# Patient Record
Sex: Male | Born: 1980 | Race: White | Hispanic: Yes | Marital: Single | Smoking: Current some day smoker
Health system: Southern US, Community
[De-identification: ages and names within clinical notes are randomized; demographics above are authoritative.]

---

## 2005-05-30 ENCOUNTER — Emergency Department (HOSPITAL_COMMUNITY): Admission: EM | Admit: 2005-05-30 | Discharge: 2005-05-30 | Payer: Self-pay | Admitting: Emergency Medicine

## 2005-09-01 ENCOUNTER — Emergency Department (HOSPITAL_COMMUNITY): Admission: EM | Admit: 2005-09-01 | Discharge: 2005-09-01 | Payer: Self-pay | Admitting: Emergency Medicine

## 2005-09-17 ENCOUNTER — Emergency Department (HOSPITAL_COMMUNITY): Admission: EM | Admit: 2005-09-17 | Discharge: 2005-09-17 | Payer: Self-pay | Admitting: Emergency Medicine

## 2009-07-13 ENCOUNTER — Emergency Department (HOSPITAL_COMMUNITY): Admission: EM | Admit: 2009-07-13 | Discharge: 2009-07-13 | Payer: Self-pay | Admitting: Family Medicine

## 2010-06-21 ENCOUNTER — Inpatient Hospital Stay (HOSPITAL_COMMUNITY): Admission: EM | Admit: 2010-06-21 | Discharge: 2010-06-23 | Payer: Self-pay | Admitting: Emergency Medicine

## 2010-06-28 ENCOUNTER — Encounter
Admission: RE | Admit: 2010-06-28 | Discharge: 2010-08-04 | Payer: Self-pay | Source: Home / Self Care | Attending: Otolaryngology | Admitting: Otolaryngology

## 2010-07-07 ENCOUNTER — Ambulatory Visit (HOSPITAL_COMMUNITY): Admission: RE | Admit: 2010-07-07 | Discharge: 2010-07-07 | Payer: Self-pay | Admitting: Otolaryngology

## 2010-10-26 LAB — AFB CULTURE WITH SMEAR (NOT AT ARMC): Acid Fast Smear: NONE SEEN

## 2010-10-26 LAB — CBC
HCT: 41.1 % (ref 39.0–52.0)
HCT: 41.7 % (ref 39.0–52.0)
HCT: 43.1 % (ref 39.0–52.0)
Hemoglobin: 14.5 g/dL (ref 13.0–17.0)
Hemoglobin: 14.7 g/dL (ref 13.0–17.0)
Hemoglobin: 15.6 g/dL (ref 13.0–17.0)
MCH: 28.4 pg (ref 26.0–34.0)
MCH: 28.7 pg (ref 26.0–34.0)
MCH: 28.9 pg (ref 26.0–34.0)
MCHC: 35.3 g/dL (ref 30.0–36.0)
MCHC: 35.3 g/dL (ref 30.0–36.0)
MCHC: 36.2 g/dL — ABNORMAL HIGH (ref 30.0–36.0)
MCV: 80 fL (ref 78.0–100.0)
MCV: 80.7 fL (ref 78.0–100.0)
MCV: 81.2 fL (ref 78.0–100.0)
Platelets: 136 10*3/uL — ABNORMAL LOW (ref 150–400)
Platelets: 143 10*3/uL — ABNORMAL LOW (ref 150–400)
Platelets: 171 10*3/uL (ref 150–400)
RBC: 5.06 MIL/uL (ref 4.22–5.81)
RBC: 5.17 MIL/uL (ref 4.22–5.81)
RBC: 5.39 MIL/uL (ref 4.22–5.81)
RDW: 12.4 % (ref 11.5–15.5)
RDW: 12.6 % (ref 11.5–15.5)
RDW: 12.7 % (ref 11.5–15.5)
WBC: 5.8 10*3/uL (ref 4.0–10.5)
WBC: 5.9 10*3/uL (ref 4.0–10.5)
WBC: 9.5 10*3/uL (ref 4.0–10.5)

## 2010-10-26 LAB — DIFFERENTIAL
Basophils Absolute: 0.1 10*3/uL (ref 0.0–0.1)
Basophils Relative: 1 % (ref 0–1)
Eosinophils Absolute: 0.2 10*3/uL (ref 0.0–0.7)
Eosinophils Relative: 3 % (ref 0–5)
Lymphocytes Relative: 23 % (ref 12–46)
Lymphs Abs: 2.2 10*3/uL (ref 0.7–4.0)
Monocytes Absolute: 0.8 10*3/uL (ref 0.1–1.0)
Monocytes Relative: 8 % (ref 3–12)
Neutro Abs: 6.2 10*3/uL (ref 1.7–7.7)
Neutrophils Relative %: 66 % (ref 43–77)

## 2010-10-26 LAB — GRAM STAIN

## 2010-10-26 LAB — BASIC METABOLIC PANEL
BUN: 15 mg/dL (ref 6–23)
BUN: 8 mg/dL (ref 6–23)
CO2: 25 mEq/L (ref 19–32)
CO2: 27 mEq/L (ref 19–32)
Calcium: 8.7 mg/dL (ref 8.4–10.5)
Calcium: 8.9 mg/dL (ref 8.4–10.5)
Chloride: 106 mEq/L (ref 96–112)
Chloride: 106 mEq/L (ref 96–112)
Creatinine, Ser: 0.78 mg/dL (ref 0.4–1.5)
Creatinine, Ser: 0.98 mg/dL (ref 0.4–1.5)
GFR calc Af Amer: >60 mL/min (ref >60)
GFR calc Af Amer: >60 mL/min (ref >60)
GFR calc non Af Amer: >60 mL/min (ref >60)
GFR calc non Af Amer: >60 mL/min (ref >60)
Glucose, Bld: 88 mg/dL (ref 70–99)
Glucose, Bld: 94 mg/dL (ref 70–99)
Potassium: 3.8 mEq/L (ref 3.5–5.1)
Potassium: 3.8 mEq/L (ref 3.5–5.1)
Sodium: 138 mEq/L (ref 135–145)
Sodium: 138 mEq/L (ref 135–145)

## 2010-10-26 LAB — WOUND CULTURE

## 2010-10-26 LAB — SEDIMENTATION RATE: Sed Rate: 10 mm/hr (ref 0–16)

## 2010-10-26 LAB — ANAEROBIC CULTURE

## 2010-10-26 LAB — SURGICAL PCR SCREEN
MRSA, PCR: NEGATIVE
Staphylococcus aureus: NEGATIVE

## 2010-10-26 LAB — C-REACTIVE PROTEIN: CRP: 1.1 mg/dL — ABNORMAL HIGH (ref <0.6)

## 2012-04-28 IMAGING — CR DG HAND COMPLETE 3+V*R*
3 series · 3 of 3 positions shown · non-contrast
Comparison: None.

CLINICAL DATA: Right hand laceration.  Pain.  Infection.

RIGHT HAND - COMPLETE 3+ VIEW

[x hand pa right *]
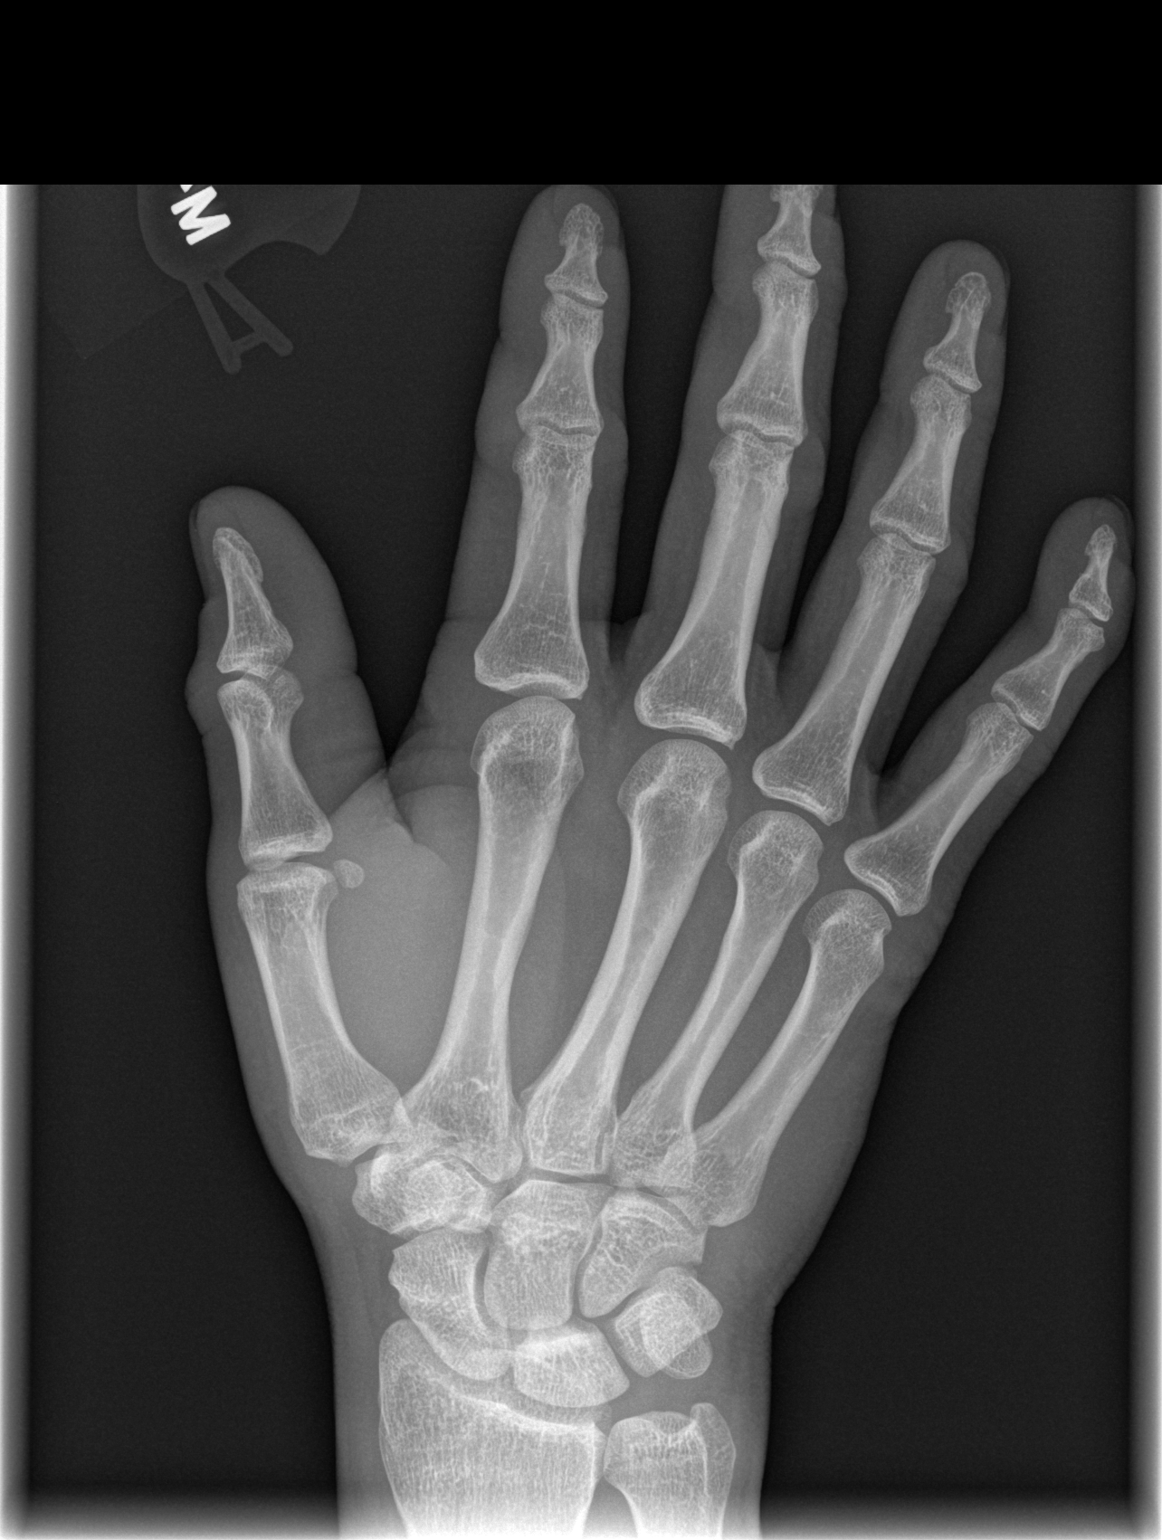

[x hand oblique right *]
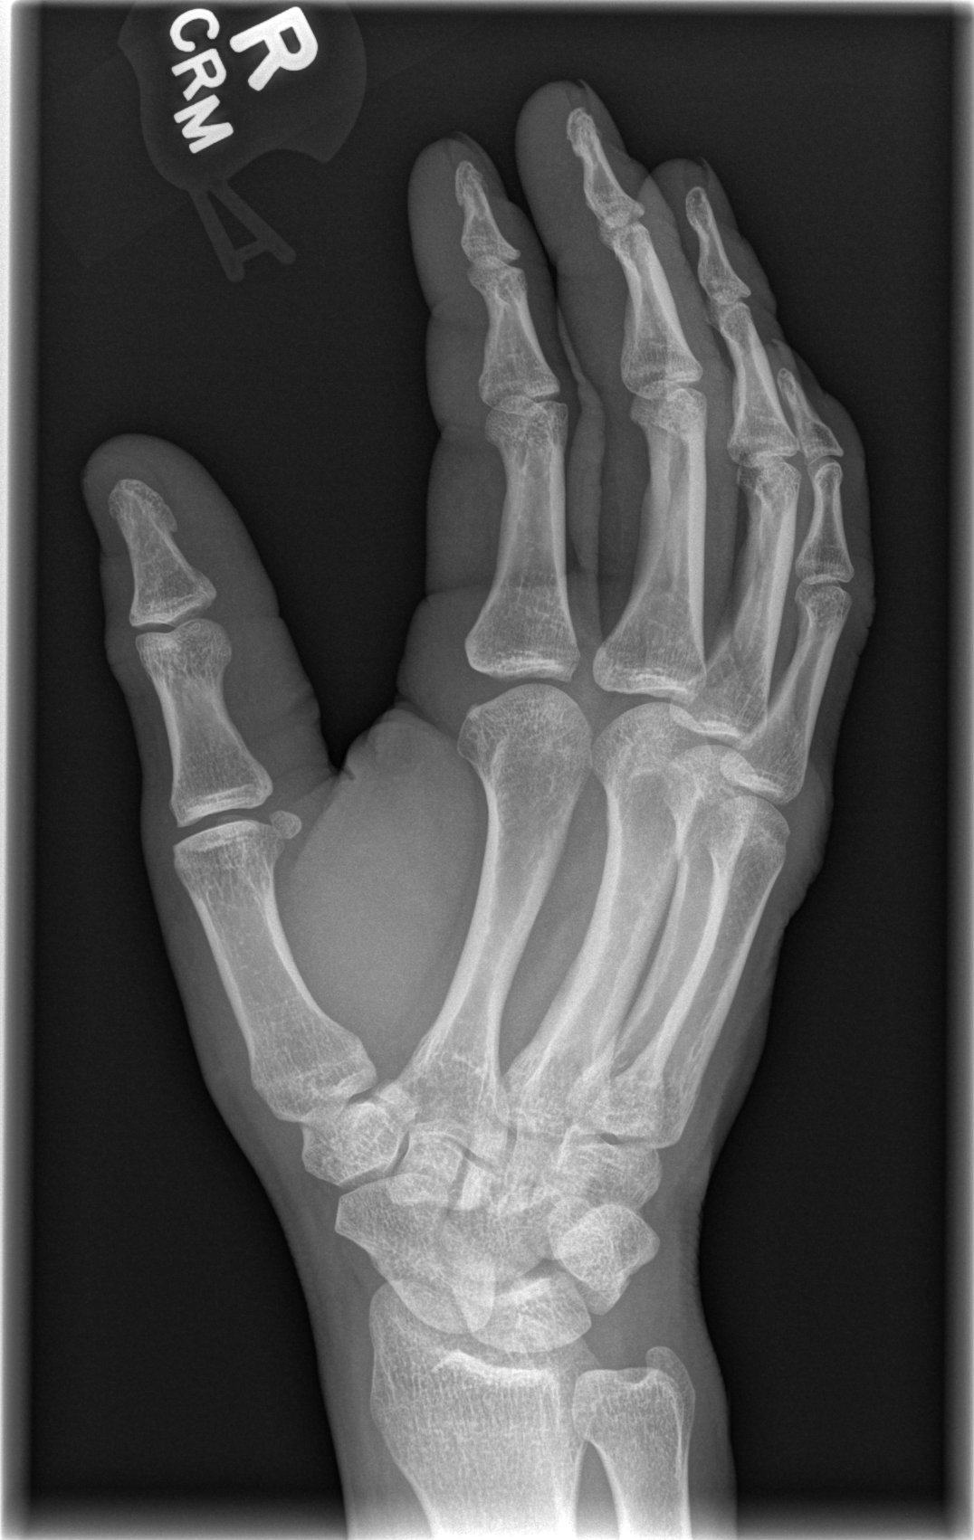

[x hand lat right]
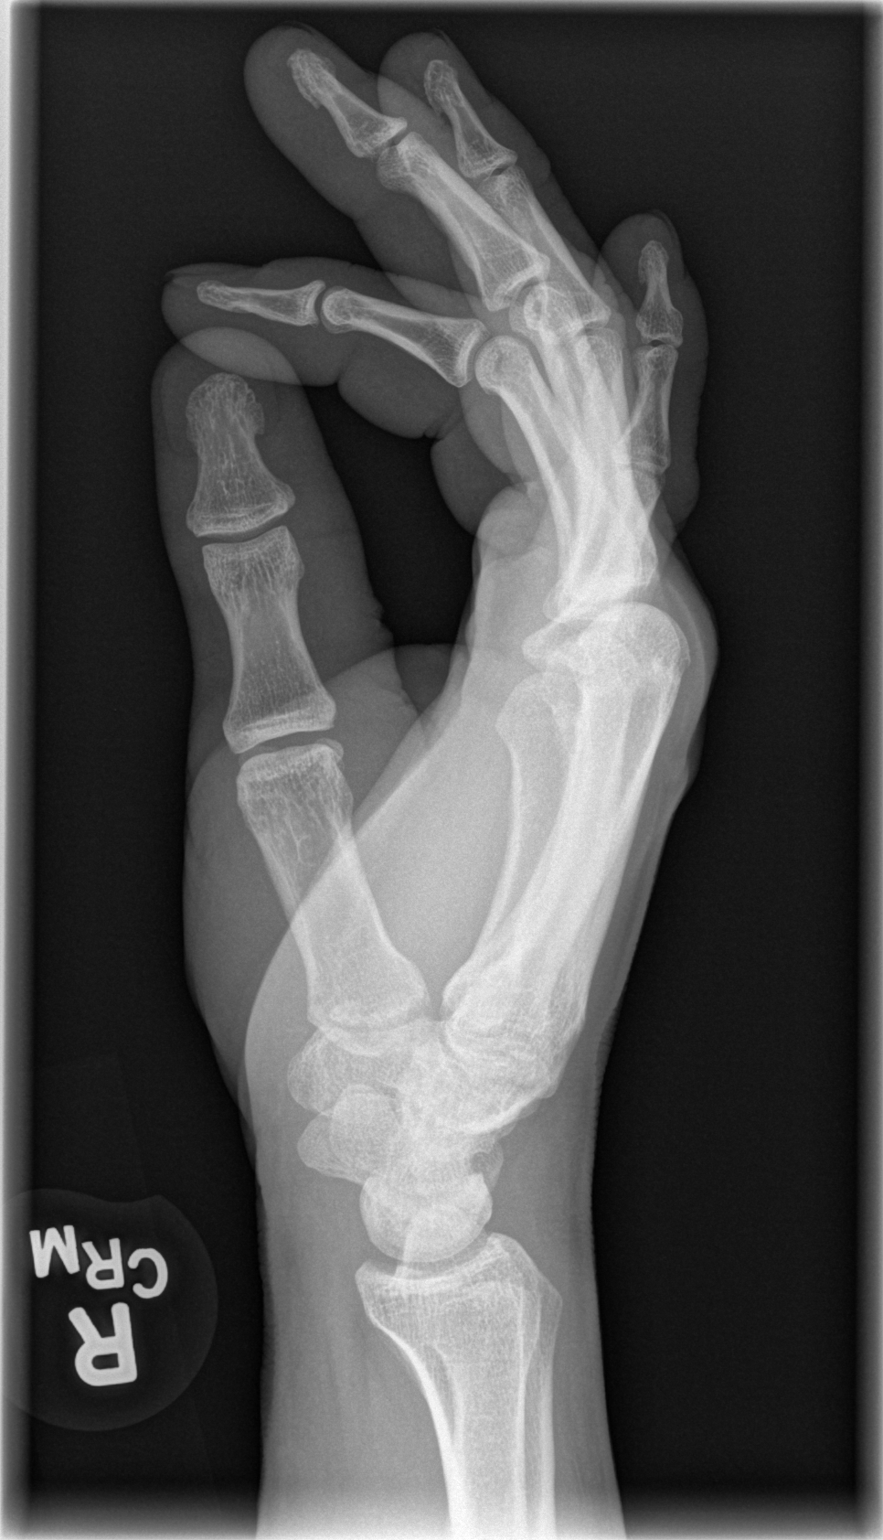

[3 of 3 positions shown; findings below may reference images not displayed]

FINDINGS: Alignment of bones of the right hand is anatomic.  There
is no fracture or radiopaque foreign body.  The first ray appears
within normal limits without fracture.
IMPRESSION: No acute abnormality.

## 2013-04-06 ENCOUNTER — Observation Stay (HOSPITAL_COMMUNITY)
Admission: EM | Admit: 2013-04-06 | Discharge: 2013-04-07 | Disposition: A | Discharge status: Home / Self Care | Payer: BC Managed Care – PPO | Attending: General Surgery | Admitting: General Surgery

## 2013-04-06 ENCOUNTER — Encounter (HOSPITAL_COMMUNITY)
Admission: EM | Disposition: A | Discharge status: Home / Self Care | Payer: Self-pay | Source: Home / Self Care | Attending: Emergency Medicine

## 2013-04-06 ENCOUNTER — Emergency Department (HOSPITAL_COMMUNITY): Payer: BC Managed Care – PPO

## 2013-04-06 ENCOUNTER — Emergency Department (HOSPITAL_COMMUNITY): Payer: BC Managed Care – PPO | Admitting: Anesthesiology

## 2013-04-06 ENCOUNTER — Encounter (HOSPITAL_COMMUNITY): Payer: Self-pay | Admitting: Anesthesiology

## 2013-04-06 ENCOUNTER — Encounter (HOSPITAL_COMMUNITY): Payer: Self-pay | Admitting: *Deleted

## 2013-04-06 DIAGNOSIS — Z01812 Encounter for preprocedural laboratory examination: Secondary | ICD-10-CM | POA: Insufficient documentation

## 2013-04-06 DIAGNOSIS — K3580 Unspecified acute appendicitis: Principal | ICD-10-CM | POA: Insufficient documentation

## 2013-04-06 DIAGNOSIS — K37 Unspecified appendicitis: Secondary | ICD-10-CM

## 2013-04-06 HISTORY — PX: LAPAROSCOPIC APPENDECTOMY: SHX408

## 2013-04-06 LAB — CBC WITH DIFFERENTIAL/PLATELET
Basophils Absolute: 0 10*3/uL (ref 0.0–0.1)
Basophils Relative: 1 % (ref 0–1)
Eosinophils Relative: 2 % (ref 0–5)
HCT: 39.6 % (ref 39.0–52.0)
Hemoglobin: 14.2 g/dL (ref 13.0–17.0)
Lymphocytes Relative: 40 % (ref 12–46)
Lymphs Abs: 2.2 10*3/uL (ref 0.7–4.0)
MCH: 28.9 pg (ref 26.0–34.0)
MCHC: 35.9 g/dL (ref 30.0–36.0)
MCV: 80.7 fL (ref 78.0–100.0)
Monocytes Absolute: 0.5 10*3/uL (ref 0.1–1.0)
Neutro Abs: 2.6 10*3/uL (ref 1.7–7.7)
Neutrophils Relative %: 49 % (ref 43–77)
Platelets: 152 10*3/uL (ref 150–400)
RBC: 4.91 MIL/uL (ref 4.22–5.81)
RDW: 13 % (ref 11.5–15.5)
WBC: 5.4 10*3/uL (ref 4.0–10.5)

## 2013-04-06 LAB — URINALYSIS, ROUTINE W REFLEX MICROSCOPIC
Bilirubin Urine: NEGATIVE
Glucose, UA: NEGATIVE mg/dL
Hgb urine dipstick: NEGATIVE
Ketones, ur: NEGATIVE mg/dL
Leukocytes, UA: NEGATIVE
Nitrite: NEGATIVE
Protein, ur: NEGATIVE mg/dL
pH: 5.5 (ref 5.0–8.0)

## 2013-04-06 LAB — COMPREHENSIVE METABOLIC PANEL
ALT: 23 U/L (ref 0–53)
AST: 21 U/L (ref 0–37)
Albumin: 3.7 g/dL (ref 3.5–5.2)
Alkaline Phosphatase: 51 U/L (ref 39–117)
CO2: 25 mEq/L (ref 19–32)
Chloride: 104 mEq/L (ref 96–112)
Creatinine, Ser: 0.78 mg/dL (ref 0.50–1.35)
GFR calc Af Amer: >90 mL/min (ref >90)
GFR calc non Af Amer: >90 mL/min (ref >90)
Glucose, Bld: 73 mg/dL (ref 70–99)
Potassium: 3.4 mEq/L — ABNORMAL LOW (ref 3.5–5.1)
Sodium: 136 mEq/L (ref 135–145)
Total Bilirubin: 0.5 mg/dL (ref 0.3–1.2)
Total Protein: 7 g/dL (ref 6.0–8.3)

## 2013-04-06 LAB — LIPASE, BLOOD: Lipase: 16 U/L (ref 11–59)

## 2013-04-06 SURGERY — APPENDECTOMY, LAPAROSCOPIC
Anesthesia: General | Site: Abdomen | Wound class: Contaminated

## 2013-04-06 MED ORDER — IOHEXOL 300 MG/ML  SOLN
100.0000 mL | Freq: Once | INTRAMUSCULAR | Status: AC | PRN
  Administered 2013-04-06: 100 mL via INTRAVENOUS

## 2013-04-06 MED ORDER — GLYCOPYRROLATE 0.2 MG/ML IJ SOLN
INTRAMUSCULAR | Status: DC | PRN
  Administered 2013-04-06: 0.2 mg via INTRAVENOUS
  Administered 2013-04-06: 0.6 mg via INTRAVENOUS

## 2013-04-06 MED ORDER — SUCCINYLCHOLINE CHLORIDE 20 MG/ML IJ SOLN
INTRAMUSCULAR | Status: DC | PRN
  Administered 2013-04-06: 120 mg via INTRAVENOUS

## 2013-04-06 MED ORDER — FENTANYL CITRATE 0.05 MG/ML IJ SOLN
INTRAMUSCULAR | Status: DC | PRN
  Administered 2013-04-06 (×2): 50 ug via INTRAVENOUS
  Administered 2013-04-06: 100 ug via INTRAVENOUS
  Administered 2013-04-06 (×3): 50 ug via INTRAVENOUS

## 2013-04-06 MED ORDER — SODIUM CHLORIDE 0.9 % IV BOLUS (SEPSIS)
1000.0000 mL | Freq: Once | INTRAVENOUS | Status: AC
  Administered 2013-04-06: 1000 mL via INTRAVENOUS

## 2013-04-06 MED ORDER — DEXTROSE 5 % IV SOLN
2.0000 g | INTRAVENOUS | Status: AC
  Administered 2013-04-06: 2 g via INTRAVENOUS

## 2013-04-06 MED ORDER — LACTATED RINGERS IV SOLN
INTRAVENOUS | Status: DC | PRN
  Administered 2013-04-06: 22:00:00 via INTRAVENOUS

## 2013-04-06 MED ORDER — ROCURONIUM BROMIDE 100 MG/10ML IV SOLN
INTRAVENOUS | Status: DC | PRN
  Administered 2013-04-06: 5 mg via INTRAVENOUS
  Administered 2013-04-06: 20 mg via INTRAVENOUS

## 2013-04-06 MED ORDER — LACTATED RINGERS IV SOLN: INTRAVENOUS | Status: DC | PRN

## 2013-04-06 MED ORDER — CELECOXIB 100 MG PO CAPS
200.0000 mg | ORAL_CAPSULE | Freq: Two times a day (BID) | ORAL | Status: DC
  Administered 2013-04-06 – 2013-04-07 (×2): 200 mg via ORAL
  Filled 2013-04-06 (×2): qty 2

## 2013-04-06 MED ORDER — PANTOPRAZOLE SODIUM 40 MG IV SOLR
40.0000 mg | Freq: Every day | INTRAVENOUS | Status: DC
  Administered 2013-04-06: 40 mg via INTRAVENOUS
  Filled 2013-04-06: qty 40

## 2013-04-06 MED ORDER — BUPIVACAINE HCL 0.5 % IJ SOLN
INTRAMUSCULAR | Status: DC | PRN
  Administered 2013-04-06: 10 mL

## 2013-04-06 MED ORDER — HYDROCODONE-ACETAMINOPHEN 5-325 MG PO TABS
1.0000 | ORAL_TABLET | ORAL | Status: DC | PRN
  Administered 2013-04-06: 1 via ORAL
  Administered 2013-04-07 (×2): 2 via ORAL
  Filled 2013-04-06: qty 1
  Filled 2013-04-06 (×2): qty 2

## 2013-04-06 MED ORDER — MIDAZOLAM HCL 5 MG/5ML IJ SOLN
INTRAMUSCULAR | Status: DC | PRN
  Administered 2013-04-06: 2 mg via INTRAVENOUS

## 2013-04-06 MED ORDER — SODIUM CHLORIDE 0.9 % IV SOLN
INTRAVENOUS | Status: DC
  Administered 2013-04-06: 20:00:00 via INTRAVENOUS

## 2013-04-06 MED ORDER — NEOSTIGMINE METHYLSULFATE 1 MG/ML IJ SOLN
INTRAMUSCULAR | Status: DC | PRN
  Administered 2013-04-06: 4 mg via INTRAVENOUS

## 2013-04-06 MED ORDER — SODIUM CHLORIDE 0.9 % IR SOLN
Status: DC | PRN
  Administered 2013-04-06: 1000 mL

## 2013-04-06 MED ORDER — ONDANSETRON HCL 4 MG/2ML IJ SOLN: 4.0000 mg | Freq: Four times a day (QID) | INTRAMUSCULAR | Status: DC | PRN

## 2013-04-06 MED ORDER — IOHEXOL 300 MG/ML  SOLN
50.0000 mL | Freq: Once | INTRAMUSCULAR | Status: AC | PRN
  Administered 2013-04-06: 50 mL via ORAL

## 2013-04-06 MED ORDER — CHLORDIAZEPOXIDE HCL 25 MG PO CAPS: 25.0000 mg | ORAL_CAPSULE | Freq: Three times a day (TID) | ORAL | Status: DC | PRN

## 2013-04-06 SURGICAL SUPPLY — 48 items
APL SKNCLS STERI-STRIP NONHPOA (GAUZE/BANDAGES/DRESSINGS) ×1
BAG HAMPER (MISCELLANEOUS) ×2 IMPLANT
BAG SPEC RTRVL LRG 6X4 10 (ENDOMECHANICALS) ×1
BENZOIN TINCTURE PRP APPL 2/3 (GAUZE/BANDAGES/DRESSINGS) ×2 IMPLANT
CLOTH BEACON ORANGE TIMEOUT ST (SAFETY) ×2 IMPLANT
COVER LIGHT HANDLE STERIS (MISCELLANEOUS) ×4 IMPLANT
CUTTER ENDO LINEAR 45M (STAPLE) ×1 IMPLANT
CUTTER FLEX LINEAR 45M (STAPLE) ×1 IMPLANT
DECANTER SPIKE VIAL GLASS SM (MISCELLANEOUS) ×2 IMPLANT
DEVICE TROCAR PUNCTURE CLOSURE (ENDOMECHANICALS) ×2 IMPLANT
DURAPREP 26ML APPLICATOR (WOUND CARE) ×2 IMPLANT
ELECT REM PT RETURN 9FT ADLT (ELECTROSURGICAL) ×2
ELECTRODE REM PT RTRN 9FT ADLT (ELECTROSURGICAL) ×1 IMPLANT
FILTER SMOKE EVAC LAPAROSHD (FILTER) ×2 IMPLANT
FORMALIN 10 PREFIL 120ML (MISCELLANEOUS) ×2 IMPLANT
GLOVE BIOGEL PI IND STRL 7.5 (GLOVE) ×1 IMPLANT
GLOVE BIOGEL PI INDICATOR 7.5 (GLOVE) ×1
GLOVE ECLIPSE 7.0 STRL STRAW (GLOVE) ×3 IMPLANT
GLOVE EXAM NITRILE MD LF STRL (GLOVE) ×1 IMPLANT
GLOVE INDICATOR 7.5 STRL GRN (GLOVE) ×1 IMPLANT
GOWN STRL REIN XL XLG (GOWN DISPOSABLE) ×4 IMPLANT
INST SET LAPROSCOPIC AP (KITS) ×2 IMPLANT
KIT ROOM TURNOVER APOR (KITS) ×2 IMPLANT
MANIFOLD NEPTUNE II (INSTRUMENTS) ×2 IMPLANT
NDL INSUFFLATION 14GA 120MM (NEEDLE) ×1 IMPLANT
NEEDLE INSUFFLATION 14GA 120MM (NEEDLE) ×2 IMPLANT
NS IRRIG 1000ML POUR BTL (IV SOLUTION) ×2 IMPLANT
PACK LAP CHOLE LZT030E (CUSTOM PROCEDURE TRAY) ×2 IMPLANT
PAD ARMBOARD 7.5X6 YLW CONV (MISCELLANEOUS) ×2 IMPLANT
POUCH SPECIMEN RETRIEVAL 10MM (ENDOMECHANICALS) ×2 IMPLANT
RELOAD 45 VASCULAR/THIN (ENDOMECHANICALS) IMPLANT
RELOAD STAPLE 45 2.5 WHT GRN (ENDOMECHANICALS) IMPLANT
RELOAD STAPLE 45 3.5 BLU ETS (ENDOMECHANICALS) IMPLANT
RELOAD STAPLE TA45 3.5 REG BLU (ENDOMECHANICALS) ×4 IMPLANT
SEALER TISSUE G2 CVD JAW 35 (ENDOMECHANICALS) ×1 IMPLANT
SEALER TISSUE G2 CVD JAW 45CM (ENDOMECHANICALS) ×1
SET BASIN LINEN APH (SET/KITS/TRAYS/PACK) ×2 IMPLANT
SET TUBE IRRIG SUCTION NO TIP (IRRIGATION / IRRIGATOR) IMPLANT
SLEEVE Z-THREAD 5X100MM (TROCAR) IMPLANT
STRIP CLOSURE SKIN 1/2X4 (GAUZE/BANDAGES/DRESSINGS) ×2 IMPLANT
SUT MNCRL AB 4-0 PS2 18 (SUTURE) ×2 IMPLANT
SUT VIC AB 2-0 CT2 27 (SUTURE) ×4 IMPLANT
TRAY FOLEY CATH 16FR SILVER (SET/KITS/TRAYS/PACK) ×2 IMPLANT
TROCAR Z-THAD FIOS HNDL 12X100 (TROCAR) ×2 IMPLANT
TROCAR Z-THRD FIOS HNDL 11X100 (TROCAR) ×2 IMPLANT
TROCAR Z-THREAD FIOS 5X100MM (TROCAR) ×2 IMPLANT
TUBING INSUFFLATION (TUBING) ×2 IMPLANT
WARMER LAPAROSCOPE (MISCELLANEOUS) ×2 IMPLANT

## 2013-04-06 NOTE — ED Provider Notes (Signed)
CSN: [id number]     Arrival date & time 04/06/13  1651 History    This chart was scribed for Hurman Horn, MD, by Yevette Edwards, ED Scribe. This patient was seen in room APA01/APA01 and the patient's care was started at 5:23 PM.   First MD Initiated Contact with Patient 04/06/13 1721     Chief Complaint  Patient presents with  . Abdominal Pain    The history is provided by the patient. No language interpreter was used.   HPI Comments: Gabriel Beasley is a 32 y.o. male who presents to the Emergency Department complaining of recurrent gradual-onset, right-sided mid abdominal pain which began three days ago. He has a h/o of experiencing similar symptoms once or twice a year for several years; the episodes usually last a few days and then spontaneously resolve. He reports that movement and and palpation increase the abdominal pain. Spicy foods also increase the abdominal pain. He denies dysuria, nausea, emesis, diarrhea, and hematochezia. Other than spicy food, he denies any increased pain with eating or drinking. He also denies any abdominal surgeries.  History reviewed. No pertinent past medical history. History reviewed. No pertinent past surgical history. No family history on file. History  Substance Use Topics  . Smoking status: Current Some Day Smoker  . Smokeless tobacco: Not on file  . Alcohol Use: Not on file    Review of Systems  A complete 10 system review of systems was obtained, and all systems were negative except where indicated in the HPI and PE.   Allergies  Review of patient's allergies indicates no known allergies.  Home Medications   Current Outpatient Rx  Name  Route  Sig  Dispense  Refill  . chlordiazePOXIDE (LIBRIUM) 25 MG capsule   Oral   Take 25 mg by mouth 3 (three) times daily as needed for anxiety.         Marland Kitchen HYDROcodone-acetaminophen (NORCO/VICODIN) 5-325 MG per tablet   Oral   Take 1-2 tablets by mouth every 4 (four) hours as needed.   45  tablet   0     Triage Vitals: BP 139/84  Pulse 66  Temp(Src) 98.2 F (36.8 C) (Oral)  Resp 20  Ht 5\' 11"  (1.803 m)  Wt 180 lb (81.647 kg)  BMI 25.12 kg/m2  SpO2 98%  Physical Exam  Nursing note and vitals reviewed. Constitutional:  Awake, alert, nontoxic appearance.  HENT:  Head: Atraumatic.  Eyes: Right eye exhibits no discharge. Left eye exhibits no discharge.  Neck: Neck supple.  Cardiovascular: Normal rate, regular rhythm and normal heart sounds.   Pulmonary/Chest: Effort normal and breath sounds normal. No respiratory distress. He exhibits no tenderness.  Pulse ox of 98%, normal, on RA.  Abdominal: Soft. Bowel sounds are normal. There is tenderness. There is no rebound.  Negative Rovsing.   No percussion tenderness. Mild tenderness to right lower quadrant. No CVA tenderness.  Genitourinary:  Non-tender testicles. No palpable inguinal hernias.  Musculoskeletal: He exhibits no tenderness.  Baseline ROM, no obvious new focal weakness.  Neurological: He is alert.  Mental status and motor strength appears baseline for patient and situation.  Skin: No rash noted.  Psychiatric: He has a normal mood and affect.    ED Course   DIAGNOSTIC STUDIES:  Oxygen Saturation is 98% on room air, normal by my interpretation.    COORDINATION OF CARE:  5:30 PM- Patient / Family understand and agree with initial ED impression and plan with expectations set for ED  visit. Patient / Family / Caregiver informed of clinical course, understand medical decision-making process, and agree with plan.d/w Leticia Penna for admit will see Pt in ED. 1850 Procedures (including critical care time)  Labs Reviewed  COMPREHENSIVE METABOLIC PANEL - Abnormal; Notable for the following:    Potassium 3.4 (*)    All other components within normal limits  URINALYSIS, ROUTINE W REFLEX MICROSCOPIC - Abnormal; Notable for the following:    Specific Gravity, Urine >1.030 (*)    All other components within  normal limits  MRSA PCR SCREENING  CBC WITH DIFFERENTIAL  LIPASE, BLOOD   Ct Abdomen Pelvis W Contrast  04/06/2013   *RADIOLOGY REPORT*  Clinical Data:  Worsening right lower quadrant pain for several days question appendicitis  CT ABDOMEN AND PELVIS WITH CONTRAST  Technique:  Multidetector CT imaging of the abdomen and pelvis was performed following the standard protocol during bolus administration of intravenous contrast. Sagittal and coronal MPR images reconstructed from axial data set.  Contrast: 50mL OMNIPAQUE IOHEXOL 300 MG/ML  SOLN, OMNIPAQUE IOHEXOL 300 MG/ML  SOLN  Comparison: None  Findings: Lung bases clear. Liver, spleen, pancreas, kidneys, and adrenal glands normal. Enlarged retrocecal appendix with thickened wall and periappendiceal inflammatory changes consistent with acute appendicitis. No evidence of perforation or abscess. Stomach and bowel loops otherwise normal appearance. Unremarkable ureters and bladder. No mass, adenopathy, free fluid, or hernia. Bones unremarkable.  IMPRESSION: Acute retrocecal appendicitis.  Findings called to Dr. Fonnie Jarvis on 04/06/2013 at 1843 hours.   Original Report Authenticated By: Ulyses Southward, M.D.   1. Appendicitis     MDM     I personally performed the services described in this documentation, which was scribed in my presence. The recorded information has been reviewed and is accurate.   Hurman Horn, MD 04/07/13 1310

## 2013-04-06 NOTE — Anesthesia Postprocedure Evaluation (Signed)
  Anesthesia Post-op Note  Patient: Gabriel Beasley  Procedure(s) Performed: Procedure(s): APPENDECTOMY LAPAROSCOPIC (N/A)  Patient Location: PACU  Anesthesia Type:General  Level of Consciousness: sedated and patient cooperative  Airway and Oxygen Therapy: Patient Spontanous Breathing and Patient connected to face mask oxygen  Post-op Pain: none  Post-op Assessment: Post-op Vital signs reviewed, Patient's Cardiovascular Status Stable, Respiratory Function Stable, Patent Airway, No signs of Nausea or vomiting and Pain level controlled  Post-op Vital Signs: Reviewed and stable  Complications: No apparent anesthesia complications

## 2013-04-06 NOTE — Anesthesia Preprocedure Evaluation (Signed)
Anesthesia Evaluation  Patient identified by MRN, date of birth, ID band Patient awake    Reviewed: Allergy & Precautions, H&P , NPO status , Patient's Chart, lab work & pertinent test results, reviewed documented beta blocker date and time   Airway Mallampati: II      Dental  (+) Chipped and Dental Advidsory Given   Pulmonary Current Smoker,  breath sounds clear to auscultation        Cardiovascular Rhythm:regular     Neuro/Psych    GI/Hepatic Patient received Oral Contrast Agents,Finished contrast 1815; abdominal pain, no vomiting since Wednesday   Endo/Other    Renal/GU      Musculoskeletal   Abdominal   Peds  Hematology   Anesthesia Other Findings   Reproductive/Obstetrics                           Anesthesia Physical Anesthesia Plan  ASA: II and emergent  Anesthesia Plan: General ETT   Post-op Pain Management:    Induction:   Airway Management Planned:   Additional Equipment:   Intra-op Plan:   Post-operative Plan:   Informed Consent: I have reviewed the patients History and Physical, chart, labs and discussed the procedure including the risks, benefits and alternatives for the proposed anesthesia with the patient or authorized representative who has indicated his/her understanding and acceptance.   Dental Advisory Given  Plan Discussed with: Anesthesiologist and Surgeon  Anesthesia Plan Comments:         Anesthesia Quick Evaluation

## 2013-04-06 NOTE — ED Notes (Signed)
Generalized abdominal pain starting 3 days ago moving to RLQ. +Vomiting per patient.

## 2013-04-06 NOTE — Transfer of Care (Signed)
Immediate Anesthesia Transfer of Care Note  Patient: Gabriel Beasley  Procedure(s) Performed: Procedure(s): APPENDECTOMY LAPAROSCOPIC (N/A)  Patient Location: PACU  Anesthesia Type:General  Level of Consciousness: sedated and patient cooperative  Airway & Oxygen Therapy: Patient Spontanous Breathing and Patient connected to face mask oxygen  Post-op Assessment: Report given to PACU RN and Post -op Vital signs reviewed and stable  Post vital signs: Reviewed and stable  Complications: No apparent anesthesia complications

## 2013-04-06 NOTE — ED Notes (Signed)
Pt and wife aware of NPO status.

## 2013-04-06 NOTE — ED Notes (Signed)
Zieglar paged to Dr. Fonnie Jarvis at [phone]. Appendicitis.

## 2013-04-06 NOTE — ED Notes (Signed)
Surgeon at the bedside, scheduling surgery tonight.

## 2013-04-06 NOTE — Preoperative (Signed)
Beta Blockers   Reason not to administer Beta Blockers:Not Applicable 

## 2013-04-06 NOTE — Anesthesia Procedure Notes (Signed)
Procedure Name: Intubation Date/Time: 04/06/2013 9:14 PM Performed by: Carolyne Littles, AMY L Pre-anesthesia Checklist: Patient identified, Patient being monitored, Timeout performed, Emergency Drugs available and Suction available Patient Re-evaluated:Patient Re-evaluated prior to inductionOxygen Delivery Method: Circle System Utilized Preoxygenation: Pre-oxygenation with 100% oxygen Intubation Type: IV induction, Rapid sequence and Cricoid Pressure applied Laryngoscope Size: 3 and Miller Grade View: Grade I Tube type: Oral Tube size: 7.0 mm Number of attempts: 1 Airway Equipment and Method: stylet Placement Confirmation: ETT inserted through vocal cords under direct vision,  positive ETCO2 and breath sounds checked- equal and bilateral Secured at: 21 cm Tube secured with: Tape Dental Injury: Teeth and Oropharynx as per pre-operative assessment

## 2013-04-06 NOTE — Op Note (Signed)
Patient:  Gabriel Beasley  DOB:  Jul 07, 1981  MRN:  [id number]   Preop Diagnosis:  Acute appendicitis  Postop Diagnosis:  Same  Procedure:  Laparoscopic appendectomy  Surgeon:  Dr. Tilford Pillar  Anes:  General endotracheal, 0.5% Sensorcaine plain for local  Indications:  Patient is a 28 male presented to [facility] right lower quadrant dominant pain. Workup and evaluation was consistent for acute appendicitis. Risks benefits alternatives of a laparoscopic possible open appendectomy are discussed at length patient. His questions and concerns are addressed the patient as consented for the planned procedure.  Procedure note:  Patient is taken to the operating room was placed in supine position on the or table time the general anesthetic is administered. Once patient was asleep symmetrically intubated by the nurse anesthetist. At this point a Foley catheter is placed in standard sterile fashion by the operative staff. His abdomen is prepped with DuraPrep solution and draped in standard fashion. Time out was performed. Stab incision was created to [facility] with 11 blade scalpel with additional dissection down to subcutaneous tissue carried out using a Coker clamp. The fascia is grasped and lifted anteriorly. A Veress needle is inserted saline drop test is utilized confirm intraperitoneal placement. At this point pneumoperitoneum was initiated and once sufficient pneumoperitoneum was obtained a 12 mm insert overlap scope allowing visualization of the trocar entering into the peritoneal cavity. At this point the inner cannulas removed lap scope was reinserted there is no evidence of any trocar or Veress needle placement injury. At this time the remaining trochars replaced with a 5 mm in the suprapubic region, an 11 mm trocar in the left lateral abdominal wall. Patient's placed into a right Trendelenburg left lateral decubitus position. The tinea the cecum or fall down to the base of the  appendix. The appendix is noted to be in a retrocecal fashion. Careful dissection is utilized to free the peritoneal reflection off of the appendix to allow mobilization of the appendix which is noted to be dilated. The base the appendix is identified a window was created between the appendix and mesoappendix. At this point the remainder the mesoappendix is divided using the Enseal bipolar device. Once the mesoappendix is divided the base the appendix is divided using the Endo GIA 45 stapler load. This is a standard stapler. Once the appendix is free is placed into an Endo Catch bag and placed into the right upper quadrant. Inspection of the staple line indicate excellent hemostasis. There is no evidence of any bleeding from the mesoappendix. At this time attention was turned to closure.  Using an Endo Close suture passing device a 2-0 Vicryl sutures passed both the 11 and 12 mm trocar sites. With the sutures in place the appendix is removed was removed through the umbilical trocar site and intact Endo Catch bag. It is placed in the back table and sent as a perm specimen to pathology. At this point the pneumoperitoneum was evacuated. Trochars were removed. The Vicryl sutures secured. The local anesthetic is instilled. A 4-0 Monocryl utilized reapproximate the skin edges at all 3 trocar sites. The skin was washed dried moist dry towel. Benzoin is applied around incision. Half-inch are suture placed. The drapes removed patient left come out of general anesthetic. He is transferred to the PACU in stable condition. At the conclusion of procedure all instrument, sponge, needle counts are correct. Patient tolerated procedure extremely well.  Complications:  None  EBL:  Less than 100 ML's  Specimen:  Appendix

## 2013-04-06 NOTE — ED Notes (Signed)
Pt c/o right lower abd pain that started a few days ago with n/v, pt states that he has had pain like this before, that it will come and go.

## 2013-04-06 NOTE — H&P (Signed)
Gabriel Beasley is an 32 y.o. male.   Chief Complaint: Right lower quadrant abdominal pain HPI: Patient states pain started in the right lower quadrant several days ago. Has been persistent. No significant radiation. Pain has increased in intensity. He presented to the emergency department due to the continued pain. His last meal was earlier this morning. He has had associated nausea and some nonbloody emesis. No change in bowel movements. No melena hematochezia. He has had subjective fevers and chills. No similar symptomatology in the past. No sick contacts or unusual travel.  History reviewed. No pertinent past medical history.  History reviewed. No pertinent past surgical history.  No family history on file. Social History:  reports that he has been smoking.  He does not have any smokeless tobacco history on file. His alcohol and drug histories are not on file.  Allergies: No Known Allergies   (Not in a hospital admission)  Results for orders placed during the hospital encounter of 04/06/13 (from the past 48 hour(s))  CBC WITH DIFFERENTIAL     Status: None   Collection Time    04/06/13  5:36 PM      Result Value Range   WBC 5.4  4.0 - 10.5 K/uL   RBC 4.91  4.22 - 5.81 MIL/uL   Hemoglobin 14.2  13.0 - 17.0 g/dL   HCT 40.9  81.1 - 91.4 %   MCV 80.7  78.0 - 100.0 fL   MCH 28.9  26.0 - 34.0 pg   MCHC 35.9  30.0 - 36.0 g/dL   RDW 78.2  95.6 - 21.3 %   Platelets 152  150 - 400 K/uL   Neutrophils Relative % 49  43 - 77 %   Neutro Abs 2.6  1.7 - 7.7 K/uL   Lymphocytes Relative 40  12 - 46 %   Lymphs Abs 2.2  0.7 - 4.0 K/uL   Monocytes Relative 9  3 - 12 %   Monocytes Absolute 0.5  0.1 - 1.0 K/uL   Eosinophils Relative 2  0 - 5 %   Eosinophils Absolute 0.1  0.0 - 0.7 K/uL   Basophils Relative 1  0 - 1 %   Basophils Absolute 0.0  0.0 - 0.1 K/uL  COMPREHENSIVE METABOLIC PANEL     Status: Abnormal   Collection Time    04/06/13  5:36 PM      Result Value Range   Sodium 136  135 - 145  mEq/L   Potassium 3.4 (*) 3.5 - 5.1 mEq/L   Chloride 104  96 - 112 mEq/L   CO2 25  19 - 32 mEq/L   Glucose, Bld 73  70 - 99 mg/dL   BUN 13  6 - 23 mg/dL   Creatinine, Ser 0.86  0.50 - 1.35 mg/dL   Calcium 9.4  8.4 - 57.8 mg/dL   Total Protein 7.0  6.0 - 8.3 g/dL   Albumin 3.7  3.5 - 5.2 g/dL   AST 21  0 - 37 U/L   ALT 23  0 - 53 U/L   Alkaline Phosphatase 51  39 - 117 U/L   Total Bilirubin 0.5  0.3 - 1.2 mg/dL   GFR calc non Af Amer >90  >90 mL/min   GFR calc Af Amer >90  >90 mL/min   Comment: (NOTE)     The eGFR has been calculated using the CKD EPI equation.     This calculation has not been validated in all clinical situations.  eGFR's persistently <90 mL/min signify possible Chronic Kidney     Disease.  LIPASE, BLOOD     Status: None   Collection Time    04/06/13  5:36 PM      Result Value Range   Lipase 16  11 - 59 U/L  URINALYSIS, ROUTINE W REFLEX MICROSCOPIC     Status: Abnormal   Collection Time    04/06/13  5:46 PM      Result Value Range   Color, Urine YELLOW  YELLOW   APPearance CLEAR  CLEAR   Specific Gravity, Urine >1.030 (*) 1.005 - 1.030   pH 5.5  5.0 - 8.0   Glucose, UA NEGATIVE  NEGATIVE mg/dL   Hgb urine dipstick NEGATIVE  NEGATIVE   Bilirubin Urine NEGATIVE  NEGATIVE   Ketones, ur NEGATIVE  NEGATIVE mg/dL   Protein, ur NEGATIVE  NEGATIVE mg/dL   Urobilinogen, UA 0.2  0.0 - 1.0 mg/dL   Nitrite NEGATIVE  NEGATIVE   Leukocytes, UA NEGATIVE  NEGATIVE   Comment: MICROSCOPIC NOT DONE ON URINES WITH NEGATIVE PROTEIN, BLOOD, LEUKOCYTES, NITRITE, OR GLUCOSE <1000 mg/dL.   Ct Abdomen Pelvis W Contrast  04/06/2013   *RADIOLOGY REPORT*  Clinical Data:  Worsening right lower quadrant pain for several days question appendicitis  CT ABDOMEN AND PELVIS WITH CONTRAST  Technique:  Multidetector CT imaging of the abdomen and pelvis was performed following the standard protocol during bolus administration of intravenous contrast. Sagittal and coronal MPR images  reconstructed from axial data set.  Contrast: 50mL OMNIPAQUE IOHEXOL 300 MG/ML  SOLN, OMNIPAQUE IOHEXOL 300 MG/ML  SOLN  Comparison: None  Findings: Lung bases clear. Liver, spleen, pancreas, kidneys, and adrenal glands normal. Enlarged retrocecal appendix with thickened wall and periappendiceal inflammatory changes consistent with acute appendicitis. No evidence of perforation or abscess. Stomach and bowel loops otherwise normal appearance. Unremarkable ureters and bladder. No mass, adenopathy, free fluid, or hernia. Bones unremarkable.  IMPRESSION: Acute retrocecal appendicitis.  Findings called to Dr. Fonnie Jarvis on 04/06/2013 at 1843 hours.   Original Report Authenticated By: Ulyses Southward, M.D.    Review of Systems  Constitutional: Positive for fever and chills.  HENT: Negative.   Eyes: Negative.   Respiratory: Negative.   Cardiovascular: Negative.   Gastrointestinal: Positive for nausea and vomiting. Negative for heartburn, diarrhea, constipation, blood in stool and melena. Abdominal pain: rlq.  Genitourinary: Negative.   Musculoskeletal: Negative.   Skin: Negative.   Neurological: Negative.   Endo/Heme/Allergies: Negative.   Psychiatric/Behavioral: Negative.     Blood pressure 123/83, pulse 56, temperature 98.1 F (36.7 C), temperature source Oral, resp. rate 20, height 5\' 11"  (1.803 m), weight 81.647 kg (180 lb), SpO2 100.00%. Physical Exam  Constitutional: He is oriented to person, place, and time. He appears well-developed and well-nourished. No distress.  HENT:  Head: Normocephalic and atraumatic.  Eyes: Conjunctivae and EOM are normal. Pupils are equal, round, and reactive to light.  Neck: Normal range of motion. Neck supple. No tracheal deviation present. No thyromegaly present.  Cardiovascular: Normal rate, regular rhythm and normal heart sounds.   Respiratory: Effort normal and breath sounds normal.  GI: Soft. He exhibits no mass. There is tenderness (right lower quadrant,  pain at McBurney's point. No diffuse peritoneal signs.). There is rebound and guarding.  Lymphadenopathy:    He has no cervical adenopathy.  Neurological: He is alert and oriented to person, place, and time.  Skin: Skin is warm and dry.     Assessment/Plan Acute appendicitis. Risks  benefits alternatives a laparoscopic possible open appendectomy are discussed at length the patient. His questions and concerns are addressed the patient will be taken to the operating room for a planned emergent laparoscopic appendectomy. Risk including but not limited to the risk of bleeding, infection, appendiceal stump leak, intraoperative cardiac component events are all discussed with the patient. His questions and concerns are addressed. Patient will be continued on IV fluid hydration. Continued in an n.p.o. status. Continue DVT prophylaxis. Antibiotics will be continued.  Kiowa Hollar C 04/06/2013, 8:14 PM

## 2013-04-06 NOTE — Addendum Note (Signed)
Addendum created 04/06/13 2256 by Marolyn Hammock, CRNA   Modules edited: Anesthesia Medication Administration

## 2013-04-07 MED ORDER — HYDROCODONE-ACETAMINOPHEN 5-325 MG PO TABS: 1.0000 | ORAL_TABLET | ORAL | Status: DC | PRN

## 2013-04-07 NOTE — Discharge Summary (Signed)
Physician Discharge Summary  Patient ID: Jerrik Housholder MRN: [id number] DOB/AGE: 34-21-1982 34 y.o.  Admit date: 04/06/2013 Discharge date: 04/07/2013  Admission Diagnoses: Acute appendicitis  Discharge Diagnoses: The same Active Problems:   * No active hospital problems. *   Discharged Condition: stable  Hospital Course: Patient presented to [facility] with less than 24 of this of right lower quadrant abdominal pain. Workup and evaluation was consistent for acute appendicitis. Patient is taken to the operating room for planned laparoscopic appendectomy. He tolerated this well. Was monitored overnight. He is tolerating regular diet. His pain is controlled. Using bleeding. Plans are made for discharge.  Consults: None  Significant Diagnostic Studies: radiology: CT scan: Abdomen and pelvis  Treatments: IV hydration, antibiotics: Mefoxin and surgery: Laparoscopic appendectomy  Discharge Exam: Blood pressure 124/75, pulse 52, temperature 98.5 F (36.9 C), temperature source Oral, resp. rate 20, height 5\' 11"  (1.803 m), weight 81.647 kg (180 lb), SpO2 99.00%. General appearance: alert and no distress Resp: clear to auscultation bilaterally Cardio: regular rate and rhythm GI: Positive bowel sounds, soft, expected postoperative tenderness. Incisions are clean dry and intact the  Disposition:   Discharge Orders   Future Orders Complete By Expires   Call MD for:  persistant nausea and vomiting  As directed    Call MD for:  redness, tenderness, or signs of infection (pain, swelling, redness, odor or green/yellow discharge around incision site)  As directed    Call MD for:  severe uncontrolled pain  As directed    Call MD for:  temperature >100.4  As directed    Diet - low sodium heart healthy  As directed    Discharge instructions  As directed    Comments:     Increase activity as tolerated. May place ice pack for comfort.  Alternate an anti-inflammatory such as ibuprofen  (Motrin, Advil) 400-600mg  every 6 hours with the prescribed pain medication.   Do not take any additional acetaminophen as there is Tylenol in the pain medication.   Discharge wound care:  As directed    Comments:     Clean surgical sites with soap and water.  May shower the morning after surgery unless instructed by Dr. Leticia Penna otherwise.  No soaking for 2-3 weeks.    If adhesive strips are in place, they may be removed in 1-2 weeks while in the shower.   Driving Restrictions  As directed    Comments:     No driving while on pain medications.   Increase activity slowly  As directed    Lifting restrictions  As directed    Comments:     No lifting over 20lbs for 4-5 weeks post-op.       Medication List         chlordiazePOXIDE 25 MG capsule  Commonly known as:  LIBRIUM  Take 25 mg by mouth 3 (three) times daily as needed for anxiety.     HYDROcodone-acetaminophen 5-325 MG per tablet  Commonly known as:  NORCO/VICODIN  Take 1-2 tablets by mouth every 4 (four) hours as needed.           Follow-up Information   Follow up with Chanze Teagle C, MD In 3 weeks.   Specialty:  General Surgery   Contact information:   [address] [place] [address] [phone]       Signed: Fabio Bering 04/07/2013, 11:39 AM

## 2013-04-07 NOTE — Progress Notes (Signed)
UTILIZATION REVIEW COMPLETED. 

## 2013-04-07 NOTE — Progress Notes (Signed)
D/c instructions reviewed with patient and family.  Verbalized understanding.  Pt dc'd to home with family. Schonewitz, Candelaria Stagers 04/07/2013

## 2013-04-08 ENCOUNTER — Encounter (HOSPITAL_COMMUNITY): Payer: Self-pay | Admitting: General Surgery

## 2013-10-16 ENCOUNTER — Emergency Department (HOSPITAL_COMMUNITY)
Admission: EM | Admit: 2013-10-16 | Discharge: 2013-10-16 | Disposition: A | Discharge status: Home / Self Care | Payer: BC Managed Care – PPO | Attending: Emergency Medicine | Admitting: Emergency Medicine

## 2013-10-16 ENCOUNTER — Encounter (HOSPITAL_COMMUNITY): Payer: Self-pay | Admitting: Emergency Medicine

## 2013-10-16 ENCOUNTER — Emergency Department (HOSPITAL_COMMUNITY): Payer: BC Managed Care – PPO

## 2013-10-16 DIAGNOSIS — M549 Dorsalgia, unspecified: Secondary | ICD-10-CM | POA: Insufficient documentation

## 2013-10-16 DIAGNOSIS — Z8719 Personal history of other diseases of the digestive system: Secondary | ICD-10-CM | POA: Insufficient documentation

## 2013-10-16 DIAGNOSIS — R112 Nausea with vomiting, unspecified: Secondary | ICD-10-CM | POA: Insufficient documentation

## 2013-10-16 DIAGNOSIS — R6883 Chills (without fever): Secondary | ICD-10-CM | POA: Insufficient documentation

## 2013-10-16 DIAGNOSIS — Z9089 Acquired absence of other organs: Secondary | ICD-10-CM | POA: Insufficient documentation

## 2013-10-16 DIAGNOSIS — R1031 Right lower quadrant pain: Secondary | ICD-10-CM | POA: Insufficient documentation

## 2013-10-16 DIAGNOSIS — R1032 Left lower quadrant pain: Secondary | ICD-10-CM | POA: Insufficient documentation

## 2013-10-16 DIAGNOSIS — R109 Unspecified abdominal pain: Secondary | ICD-10-CM

## 2013-10-16 DIAGNOSIS — F17200 Nicotine dependence, unspecified, uncomplicated: Secondary | ICD-10-CM | POA: Insufficient documentation

## 2013-10-16 LAB — CBC WITH DIFFERENTIAL/PLATELET
BASOS PCT: 0 % (ref 0–1)
Basophils Absolute: 0 10*3/uL (ref 0.0–0.1)
EOS ABS: 0 10*3/uL (ref 0.0–0.7)
EOS PCT: 0 % (ref 0–5)
HCT: 43.8 % (ref 39.0–52.0)
Hemoglobin: 15.6 g/dL (ref 13.0–17.0)
LYMPHS ABS: 1.2 10*3/uL (ref 0.7–4.0)
Lymphocytes Relative: 18 % (ref 12–46)
MCH: 28.9 pg (ref 26.0–34.0)
MCHC: 35.6 g/dL (ref 30.0–36.0)
MCV: 81.1 fL (ref 78.0–100.0)
Monocytes Absolute: 0.3 10*3/uL (ref 0.1–1.0)
Monocytes Relative: 4 % (ref 3–12)
NEUTROS PCT: 78 % — AB (ref 43–77)
Neutro Abs: 5.1 10*3/uL (ref 1.7–7.7)
PLATELETS: 168 10*3/uL (ref 150–400)
RBC: 5.4 MIL/uL (ref 4.22–5.81)
RDW: 12.8 % (ref 11.5–15.5)
WBC: 6.5 10*3/uL (ref 4.0–10.5)

## 2013-10-16 LAB — URINALYSIS, ROUTINE W REFLEX MICROSCOPIC
BILIRUBIN URINE: NEGATIVE
Glucose, UA: NEGATIVE mg/dL
HGB URINE DIPSTICK: NEGATIVE
KETONES UR: 15 mg/dL — AB
Leukocytes, UA: NEGATIVE
NITRITE: NEGATIVE
PROTEIN: NEGATIVE mg/dL
SPECIFIC GRAVITY, URINE: 1.01 (ref 1.005–1.030)
UROBILINOGEN UA: 0.2 mg/dL (ref 0.0–1.0)
pH: 7.5 (ref 5.0–8.0)

## 2013-10-16 LAB — COMPREHENSIVE METABOLIC PANEL
ALBUMIN: 4.1 g/dL (ref 3.5–5.2)
ALT: 33 U/L (ref 0–53)
AST: 21 U/L (ref 0–37)
Alkaline Phosphatase: 55 U/L (ref 39–117)
BUN: 11 mg/dL (ref 6–23)
CALCIUM: 9.1 mg/dL (ref 8.4–10.5)
CO2: 26 mEq/L (ref 19–32)
Chloride: 101 mEq/L (ref 96–112)
Creatinine, Ser: 0.75 mg/dL (ref 0.50–1.35)
GFR calc non Af Amer: >90 mL/min (ref >90)
GLUCOSE: 111 mg/dL — AB (ref 70–99)
POTASSIUM: 3.9 meq/L (ref 3.7–5.3)
SODIUM: 137 meq/L (ref 137–147)
TOTAL PROTEIN: 7.1 g/dL (ref 6.0–8.3)
Total Bilirubin: 0.7 mg/dL (ref 0.3–1.2)

## 2013-10-16 LAB — LIPASE, BLOOD: LIPASE: 21 U/L (ref 11–59)

## 2013-10-16 MED ORDER — HYDROMORPHONE HCL PF 1 MG/ML IJ SOLN
1.0000 mg | Freq: Once | INTRAMUSCULAR | Status: AC
  Administered 2013-10-16: 1 mg via INTRAVENOUS
  Filled 2013-10-16: qty 1

## 2013-10-16 MED ORDER — ONDANSETRON HCL 4 MG/2ML IJ SOLN
4.0000 mg | Freq: Once | INTRAMUSCULAR | Status: AC
  Administered 2013-10-16: 4 mg via INTRAVENOUS
  Filled 2013-10-16: qty 2

## 2013-10-16 MED ORDER — TRAMADOL HCL 50 MG PO TABS: 50.0000 mg | ORAL_TABLET | Freq: Four times a day (QID) | ORAL | Status: AC | PRN

## 2013-10-16 NOTE — ED Notes (Signed)
Pt alert & oriented x4, stable gait. Patient given discharge instructions, paperwork & prescription(s). Patient  instructed to stop at the registration desk to finish any additional paperwork. Patient verbalized understanding. Pt left department w/ no further questions. 

## 2013-10-16 NOTE — ED Notes (Signed)
C/O rt lower back pain that migrates to rt lower abdomen since yesterday. Also had nausea and vomiting since yesterday. Pt just had appendix removed a few months ago per pt's friend and according to her, his symptoms are the same as when he had appendicitis. Pt rates pain as 10/10.

## 2013-10-16 NOTE — ED Notes (Signed)
Abdominal pain onset 10/11/13.

## 2013-10-16 NOTE — Discharge Instructions (Signed)
Follow up with your md dr. Mikeal Hawthornegarba in one week if not improving.

## 2013-10-16 NOTE — ED Provider Notes (Signed)
CSN: [id number]     Arrival date & time 10/16/13  1536 History  This chart was scribed for Gabriel LennertJoseph L Mohogany Toppins, MD by Danella Maiersaroline Early, ED Scribe. This patient was seen in room [facility] and the patient's care was started at 5:54 PM.    Chief Complaint  Patient presents with  . Abdominal Pain   Patient is a 33 y.o. male presenting with abdominal pain. The history is provided by the patient and the spouse. A language interpreter was used.  Abdominal Pain Pain location:  LLQ and RLQ Onset quality:  Sudden Duration:  2 days Progression:  Worsening Associated symptoms: chills, nausea and vomiting   Associated symptoms: no chest pain, no cough, no diarrhea, no fatigue and no hematuria    HPI Comments: Gabriel Beasley is a 33 y.o. male who presents to the Emergency Department complaining of sudden-onset abdominal pain that started 2-3 days ago with chills, nausea, and vomiting. He had appendicitis less than 6 months ago. He states the pain started in his left back and spread to his abdomen. He denies diarrhea.    History reviewed. No pertinent past medical history. Past Surgical History  Procedure Laterality Date  . Laparoscopic appendectomy N/A 04/06/2013    Procedure: APPENDECTOMY LAPAROSCOPIC;  Surgeon: Fabio BeringBrent C Ziegler, MD;  Location: [facility];  Service: General;  Laterality: N/A;   No family history on file. History  Substance Use Topics  . Smoking status: Current Some Day Smoker  . Smokeless tobacco: Not on file  . Alcohol Use: Not on file    Review of Systems  Constitutional: Positive for chills. Negative for appetite change and fatigue.  HENT: Negative for congestion, ear discharge and sinus pressure.   Eyes: Negative for discharge.  Respiratory: Negative for cough.   Cardiovascular: Negative for chest pain.  Gastrointestinal: Positive for nausea, vomiting and abdominal pain. Negative for diarrhea.  Genitourinary: Negative for frequency and hematuria.  Musculoskeletal: Positive for  back pain.  Skin: Negative for rash.  Neurological: Negative for seizures and headaches.  Psychiatric/Behavioral: Negative for hallucinations.      Allergies  Review of patient's allergies indicates no known allergies.  Home Medications   Current Outpatient Rx  Name  Route  Sig  Dispense  Refill  . acetaminophen (TYLENOL) 500 MG tablet   Oral   Take 1,000 mg by mouth daily as needed for mild pain or moderate pain.         Marland Kitchen. ibuprofen (ADVIL,MOTRIN) 200 MG tablet   Oral   Take 800 mg by mouth daily as needed for fever, mild pain or moderate pain.         . Multiple Vitamin (MULTIVITAMIN WITH MINERALS) TABS tablet   Oral   Take 1 tablet by mouth once a week.          BP 132/84  Pulse 61  Temp(Src) 98.4 F (36.9 C) (Oral)  Resp 18  Ht 5\' 9"  (1.753 m)  Wt 180 lb (81.647 kg)  BMI 26.57 kg/m2  SpO2 98% Physical Exam  Constitutional: He is oriented to person, place, and time. He appears well-developed.  HENT:  Head: Normocephalic.  Eyes: Conjunctivae and EOM are normal. No scleral icterus.  Neck: Neck supple. No thyromegaly present.  Cardiovascular: Normal rate and regular rhythm.  Exam reveals no gallop and no friction rub.   No murmur heard. Pulmonary/Chest: No stridor. He has no wheezes. He has no rales. He exhibits no tenderness.  Abdominal: He exhibits no distension. There is tenderness (mild  right flank, RLQ and LLQ). There is no rebound.  Musculoskeletal: Normal range of motion. He exhibits no edema.  Lymphadenopathy:    He has no cervical adenopathy.  Neurological: He is oriented to person, place, and time. He exhibits normal muscle tone. Coordination normal.  Skin: No rash noted. No erythema.  Psychiatric: He has a normal mood and affect. His behavior is normal.    ED Course  Procedures (including critical care time) Medications  HYDROmorphone (DILAUDID) injection 1 mg (not administered)  ondansetron (ZOFRAN) injection 4 mg (not administered)     DIAGNOSTIC STUDIES: Oxygen Saturation is 98% on RA, normal by my interpretation.    COORDINATION OF CARE: 6:04 PM- Discussed treatment plan with pt. Pt agrees to plan.  9:22 PM - Rechecked with pt to let him know his tests were normal and he will be discharged home.    Labs Review Labs Reviewed  URINALYSIS, ROUTINE W REFLEX MICROSCOPIC - Abnormal; Notable for the following:    Ketones, ur 15 (*)    All other components within normal limits  CBC WITH DIFFERENTIAL - Abnormal; Notable for the following:    Neutrophils Relative % 78 (*)    All other components within normal limits  COMPREHENSIVE METABOLIC PANEL - Abnormal; Notable for the following:    Glucose, Bld 111 (*)    All other components within normal limits  LIPASE, BLOOD   Imaging Review Ct Abdomen Pelvis Wo Contrast  10/16/2013   CLINICAL DATA:  Right flank pain. Right lower quadrant pain and left lower quadrant pain for 2 days.  EXAM: CT ABDOMEN AND PELVIS WITHOUT CONTRAST  TECHNIQUE: Multidetector CT imaging of the abdomen and pelvis was performed following the standard protocol without intravenous contrast.  COMPARISON:  CT ABD - PELV W/ CM dated 04/06/2013; DG LUMBAR SPINE COMPLETE dated 09/01/2005  FINDINGS: Lung Bases: Clear.  Liver: Normal. Unenhanced CT was performed per clinician order. Lack of IV contrast limits sensitivity and specificity, especially for evaluation of abdominal/pelvic solid viscera.  Spleen:  Normal.  Gallbladder:  Normal.  Common bile duct:  Normal.  Pancreas:  Normal.  Adrenal glands:  Normal.  Kidneys: No hydronephrosis or renal calculi. Both ureters appear within normal limits.  Stomach:  Patulous gastroesophageal junction.  Small bowel:  Normal.  Colon:   Appendectomy.  No inflammatory changes or obstruction.  Pelvic Genitourinary: Normal. Urinary bladder partially collapsed. No bladder stones.  Bones:  Normal.  Vasculature: Grossly normal.  Body Wall: Normal.  IMPRESSION: Negative CT urogram.   Appendectomy.   Electronically Signed   By: Andreas Newport M.D.   On: 10/16/2013 18:38     EKG Interpretation None      MDM   Final diagnoses:  None   The chart was scribed for me under my direct supervision.  I personally performed the history, physical, and medical decision making and all procedures in the evaluation of this patient.Gabriel Lennert, MD 10/16/13 2123

## 2015-09-30 ENCOUNTER — Ambulatory Visit: Payer: Self-pay | Admitting: Family Medicine

## 2015-10-01 ENCOUNTER — Ambulatory Visit: Payer: Self-pay | Admitting: Family Medicine

## 2015-10-02 ENCOUNTER — Encounter: Payer: Self-pay | Admitting: Family Medicine

## 2016-10-06 ENCOUNTER — Ambulatory Visit: Payer: Self-pay | Admitting: Family Medicine

## 2016-10-10 ENCOUNTER — Encounter: Payer: Self-pay | Admitting: Family Medicine

## 2016-11-17 ENCOUNTER — Encounter: Payer: Self-pay | Admitting: Family Medicine

## 2016-11-18 ENCOUNTER — Encounter: Payer: Self-pay | Admitting: General Practice

## 2016-12-01 ENCOUNTER — Encounter: Payer: Self-pay | Admitting: Family Medicine

## 2016-12-15 ENCOUNTER — Encounter: Payer: Self-pay | Admitting: Family Medicine

## 2016-12-29 ENCOUNTER — Encounter: Payer: Self-pay | Admitting: Family Medicine

## 2017-01-12 ENCOUNTER — Encounter: Payer: Self-pay | Admitting: Family Medicine

## 2017-01-20 ENCOUNTER — Encounter: Payer: Self-pay | Admitting: Family Medicine

## 2017-01-23 ENCOUNTER — Encounter: Payer: Self-pay | Admitting: General Practice
